# Patient Record
Sex: Male | Born: 1945 | Race: White | Hispanic: No | Marital: Single
Health system: Southern US, Community
[De-identification: ages and names within clinical notes are randomized; demographics above are authoritative.]

## PROBLEM LIST (undated history)

## (undated) DIAGNOSIS — F0390 Unspecified dementia without behavioral disturbance: Secondary | ICD-10-CM

## (undated) DIAGNOSIS — N390 Urinary tract infection, site not specified: Secondary | ICD-10-CM

## (undated) DIAGNOSIS — S069X9A Unspecified intracranial injury with loss of consciousness of unspecified duration, initial encounter: Secondary | ICD-10-CM

## (undated) DIAGNOSIS — S069XAA Unspecified intracranial injury with loss of consciousness status unknown, initial encounter: Secondary | ICD-10-CM

---

## 2016-10-19 ENCOUNTER — Emergency Department (HOSPITAL_COMMUNITY): Payer: Medicare Other

## 2016-10-19 ENCOUNTER — Encounter (HOSPITAL_COMMUNITY): Payer: Self-pay

## 2016-10-19 ENCOUNTER — Emergency Department (HOSPITAL_COMMUNITY)
Admission: EM | Admit: 2016-10-19 | Discharge: 2016-10-19 | Disposition: A | Discharge status: Home / Self Care | Payer: Medicare Other | Attending: Emergency Medicine | Admitting: Emergency Medicine

## 2016-10-19 DIAGNOSIS — X58XXXD Exposure to other specified factors, subsequent encounter: Secondary | ICD-10-CM | POA: Insufficient documentation

## 2016-10-19 DIAGNOSIS — F0390 Unspecified dementia without behavioral disturbance: Secondary | ICD-10-CM | POA: Diagnosis not present

## 2016-10-19 DIAGNOSIS — Z0000 Encounter for general adult medical examination without abnormal findings: Secondary | ICD-10-CM | POA: Diagnosis not present

## 2016-10-19 DIAGNOSIS — Z139 Encounter for screening, unspecified: Secondary | ICD-10-CM

## 2016-10-19 DIAGNOSIS — S7292XD Unspecified fracture of left femur, subsequent encounter for closed fracture with routine healing: Secondary | ICD-10-CM | POA: Diagnosis present

## 2016-10-19 HISTORY — DX: Urinary tract infection, site not specified: N39.0

## 2016-10-19 HISTORY — DX: Unspecified intracranial injury with loss of consciousness status unknown, initial encounter: S06.9XAA

## 2016-10-19 HISTORY — DX: Unspecified intracranial injury with loss of consciousness of unspecified duration, initial encounter: S06.9X9A

## 2016-10-19 HISTORY — DX: Unspecified dementia, unspecified severity, without behavioral disturbance, psychotic disturbance, mood disturbance, and anxiety: F03.90

## 2016-10-19 NOTE — Discharge Instructions (Signed)
Take your usual prescriptions as previously directed.  Call your regular medical doctor today to schedule a follow up appointment within the week.  Return to the Emergency Department immediately sooner if worsening.

## 2016-10-19 NOTE — ED Notes (Signed)
Pt resting with eyes open, appears to be in no distress. Respirations are even and unlabored.  

## 2016-10-19 NOTE — ED Provider Notes (Signed)
[facility]EMER[facility]ENCY DEPT Provider Note   CSN: [id number] Arrival date & time: 10/19/16  1318     History   Chief Complaint Chief Complaint  Patient presents with  . Leg Injury    HPI Keith Ayala is a 70 y.o. male.  The history is provided by the patient and the nursing home. The history is limited by the condition of the patient (Hx dementia).  Pt was seen at 1445. Per NH report: Pt sent to the ED after mobile XR on 10/15/16 dx "left femur fracture." Pt states he "feels fine" and "doesn't need to be here." Denies leg pain, no falls. Pt with hx dementia.   Past Medical History:  Diagnosis Date  . Brain injury (HCC)   . Dementia   . UTI (urinary tract infection)     There are no active problems to display for this patient.   History reviewed. No pertinent surgical history.     Home Medications    Prior to Admission medications   Not on File    Family History No family history on file.  Social History Social History  Substance Use Topics  . Smoking status: Unknown If Ever Smoked  . Smokeless tobacco: Not on file  . Alcohol use No     Allergies   Review of patient's allergies indicates no known allergies.   Review of Systems Review of Systems  Unable to perform ROS: Dementia     Physical Exam Updated Vital Signs BP 156/89 (BP Location: Left Arm)   Pulse 72   Temp 98.6 F (37 C)   Resp 18   SpO2 98%   Physical Exam 1450: Physical examination:  Nursing notes reviewed; Vital signs and O2 SAT reviewed;  Constitutional: Well developed, Well nourished, Well hydrated, In no acute distress; Head:  Normocephalic, atraumatic; Eyes: EOMI, PERRL, No scleral icterus; ENMT: Mouth and pharynx normal, Mucous membranes moist; Neck: Supple, Full range of motion, No lymphadenopathy; Cardiovascular: Regular rate and rhythm, No gallop; Respiratory: Breath sounds clear & equal bilaterally, No wheezes.  Speaking full sentences with ease, Normal respiratory  effort/excursion; Chest: Nontender, Movement normal; Abdomen: Soft, Nontender, Nondistended, Normal bowel sounds; Genitourinary: No CVA tenderness; Extremities: Pelvis stable. LLE NT to palp, no deformity, no rash, +well healed surgical scars. Pulses normal, No tenderness, No edema, No calf edema or asymmetry.; Neuro: Awake, alert, confused per hx dementia. Major CN grossly intact. No facial droop. Speech clear. No gross focal motor deficits in extremities.; Skin: Color normal, Warm, Dry.   ED Treatments / Results  Labs (all labs ordered are listed, but only abnormal results are displayed)   EKG  EKG Interpretation None       Radiology   Procedures Procedures (including critical care time)  Medications Ordered in ED Medications - No data to display   Initial Impression / Assessment and Plan / ED Course  I have reviewed the triage vital signs and the nursing notes.  Pertinent labs & imaging results that were available during my care of the patient were reviewed by me and considered in my medical decision making (see chart for details).  MDM Reviewed: previous chart, nursing note and vitals Interpretation: x-ray   Dg Femur Min 2 Views Left Result Date: 10/19/2016 CLINICAL DATA:  Leg pain.  Dementia. EXAM: LEFT FEMUR 2 VIEWS COMPARISON:  None. FINDINGS: The mineralization and alignment are normal. There is no evidence of acute fracture, dislocation or femoral head avascular necrosis. There is irregular cortical thickening in the proximal femoral diaphysis,  likely posttraumatic in etiology. There is some adjacent soft tissue ossification. There is also irregular cortical thickening in the proximal tibia, only visualized in the lateral projection. This also appears posttraumatic. There is an old fracture of the adjacent proximal fibula. Moderately advanced tricompartmental degenerative changes are present at the knee. There are mild degenerative changes at the hip. Femoral popliteal  atherosclerosis noted. IMPRESSION: No acute findings or clear explanation for the patient's symptoms. Probable old fractures of the proximal femur, tibia and fibula. Electronically Signed   By: Carey BullocksWilliam  Veazey M.D.   On: 10/19/2016 14:55    1505:  XR reassuring. Will d/c back to NH.     Final Clinical Impressions(s) / ED Diagnoses   Final diagnoses:  None    New Prescriptions New Prescriptions   No medications on file     Samuel JesterKathleen Trynity Skousen, DO 10/21/16 2025

## 2016-10-19 NOTE — ED Notes (Signed)
Pt up to nurses station multiple times stating he has "things to do and what's the hold up." Pt has been placed in the bed multiple times with side rails up X2, call light within reach, and offered the urinal.

## 2017-05-07 IMAGING — DX DG FEMUR 2+V*L*
5 series · 5 of 5 positions shown · non-contrast
Comparison: None.

CLINICAL DATA: Leg pain.  Dementia.

EXAM:
LEFT FEMUR 2 VIEWS

[femur ap (1 of 2)]
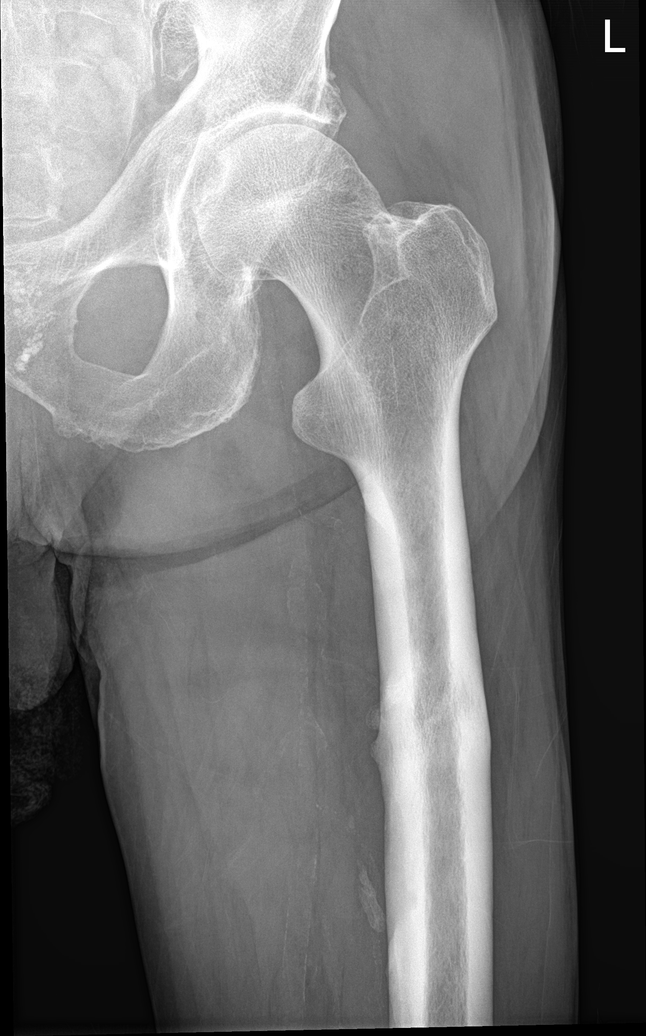

[femur ap (2 of 2)]
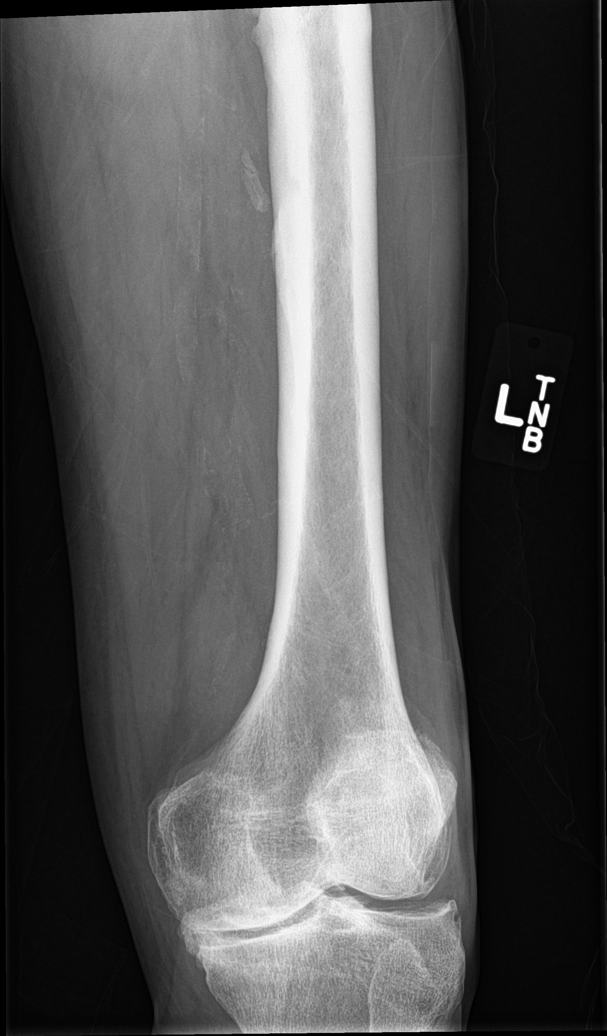

[femur lat (1 of 3)]
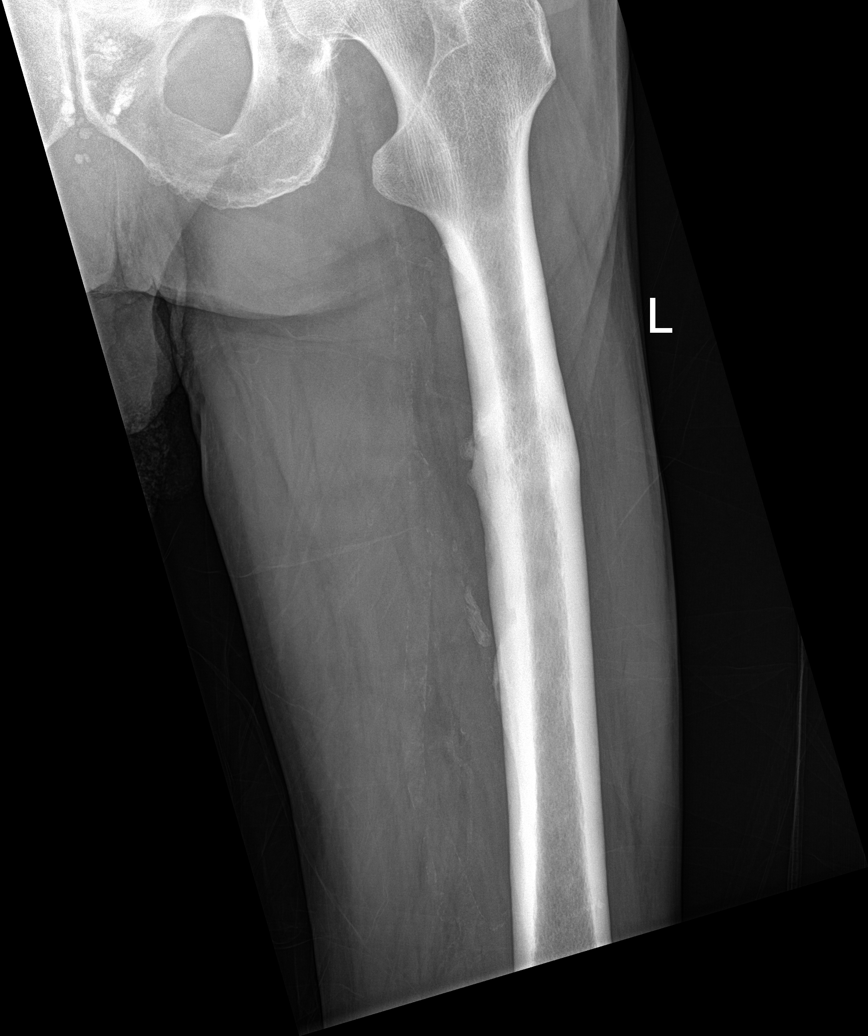

[femur lat (2 of 3)]
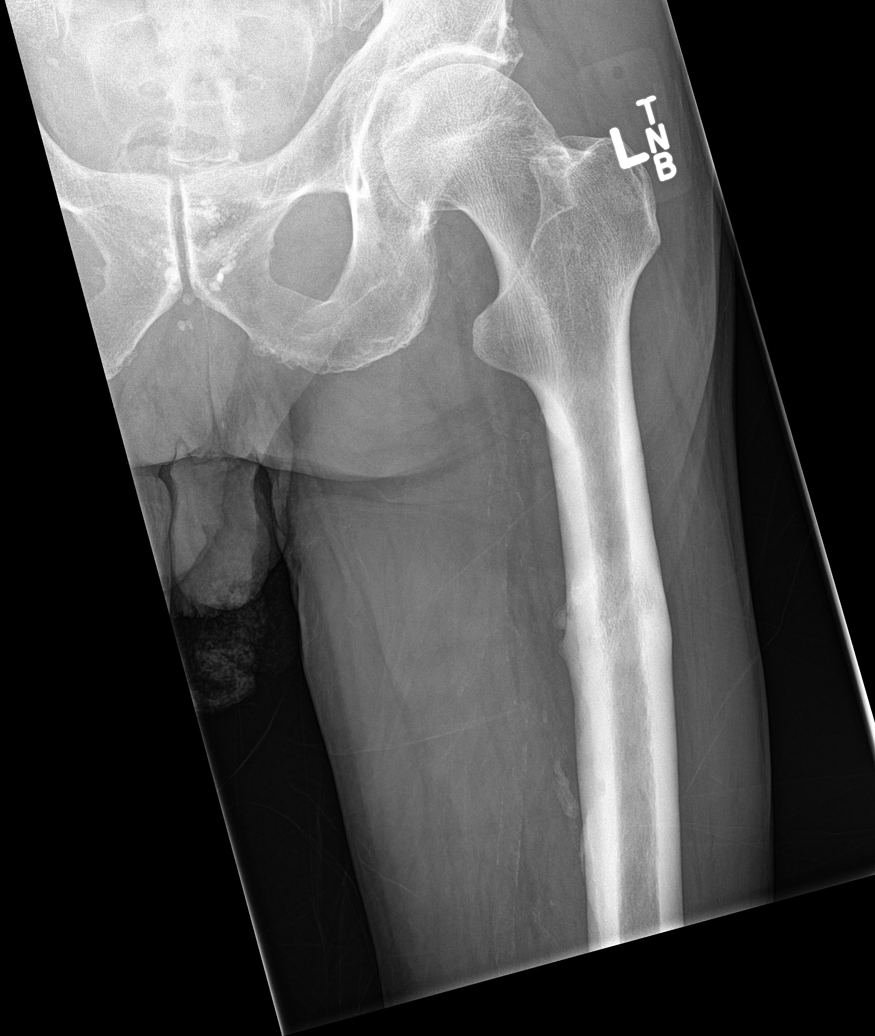

[femur lat (3 of 3)]
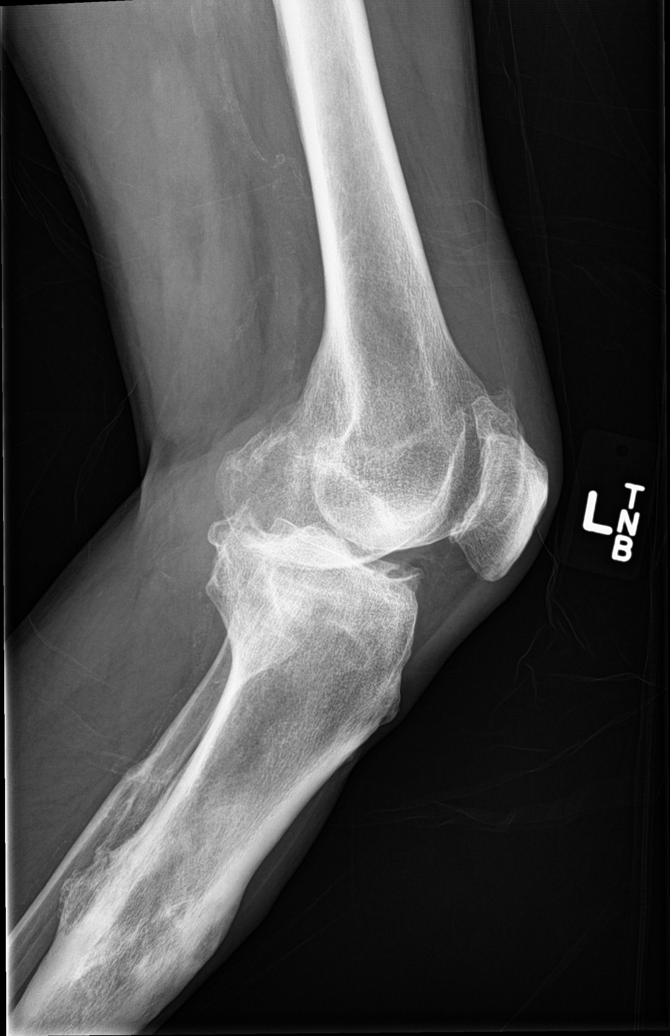

[5 of 5 positions shown; findings below may reference images not displayed]

FINDINGS: The mineralization and alignment are normal. There is no evidence of
acute fracture, dislocation or femoral head avascular necrosis.
There is irregular cortical thickening in the proximal femoral
diaphysis, likely posttraumatic in etiology. There is some adjacent
soft tissue ossification. There is also irregular cortical
thickening in the proximal tibia, only visualized in the lateral
projection. This also appears posttraumatic. There is an old
fracture of the adjacent proximal fibula. Moderately advanced
tricompartmental degenerative changes are present at the knee. There
are mild degenerative changes at the hip. Femoral popliteal
atherosclerosis noted.
IMPRESSION: No acute findings or clear explanation for the patient's symptoms.
Probable old fractures of the proximal femur, tibia and fibula.

## 2020-01-22 DEATH — deceased
